# Patient Record
Sex: Male | Born: 1955 | Race: White | Hispanic: No | Marital: Married | Smoking: Current every day smoker
Health system: Southern US, Community
[De-identification: ages and names within clinical notes are randomized; demographics above are authoritative.]

## PROBLEM LIST (undated history)

## (undated) DIAGNOSIS — K2270 Barrett's esophagus without dysplasia: Secondary | ICD-10-CM

## (undated) DIAGNOSIS — K5792 Diverticulitis of intestine, part unspecified, without perforation or abscess without bleeding: Secondary | ICD-10-CM

## (undated) DIAGNOSIS — K449 Diaphragmatic hernia without obstruction or gangrene: Secondary | ICD-10-CM

## (undated) DIAGNOSIS — E785 Hyperlipidemia, unspecified: Secondary | ICD-10-CM

## (undated) HISTORY — PX: TONSILLECTOMY: SUR1361

## (undated) HISTORY — DX: Barrett's esophagus without dysplasia: K22.70

## (undated) HISTORY — DX: Hyperlipidemia, unspecified: E78.5

## (undated) HISTORY — DX: Diverticulitis of intestine, part unspecified, without perforation or abscess without bleeding: K57.92

## (undated) HISTORY — DX: Diaphragmatic hernia without obstruction or gangrene: K44.9

## (undated) HISTORY — PX: OTHER SURGICAL HISTORY: SHX169

---

## 2010-04-15 ENCOUNTER — Ambulatory Visit: Payer: Self-pay | Admitting: Family Medicine

## 2010-07-25 ENCOUNTER — Ambulatory Visit: Payer: Self-pay | Admitting: Gastroenterology

## 2010-07-25 HISTORY — PX: COLONOSCOPY: SHX174

## 2011-09-16 ENCOUNTER — Ambulatory Visit: Payer: Self-pay | Admitting: Family Medicine

## 2013-08-21 IMAGING — CR DG RIBS 2V*L*
1 series · 4 of 4 positions shown · non-contrast
Comparison: none

REASON FOR EXAM: rib pain
COMMENTS:

[Series 1: pa · 0.17mm/px · 4 of 4 slices shown]
[im 1/4]
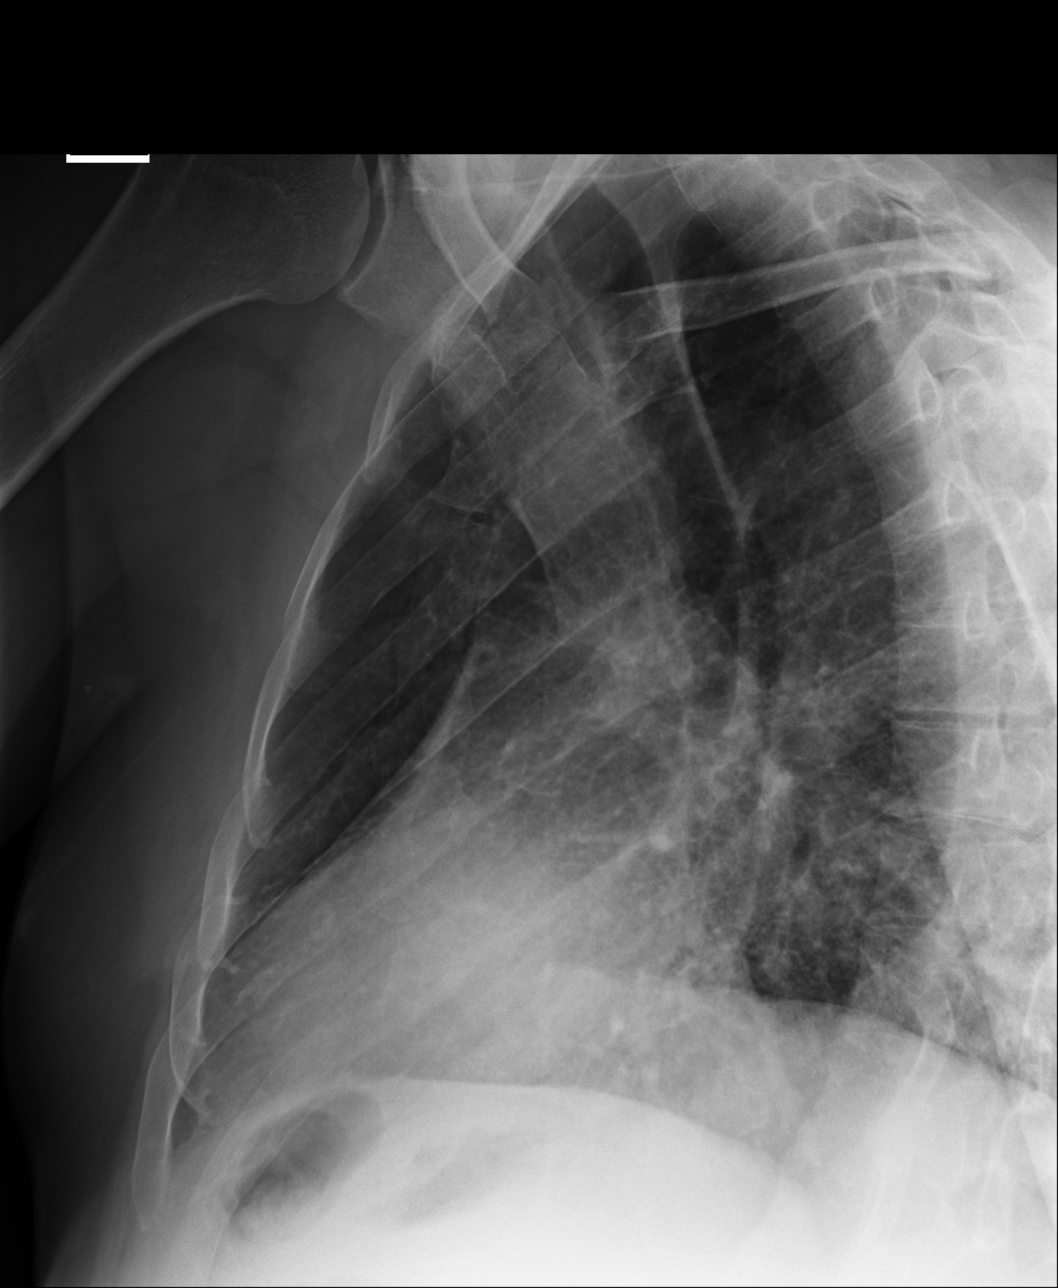
[im 2/4]
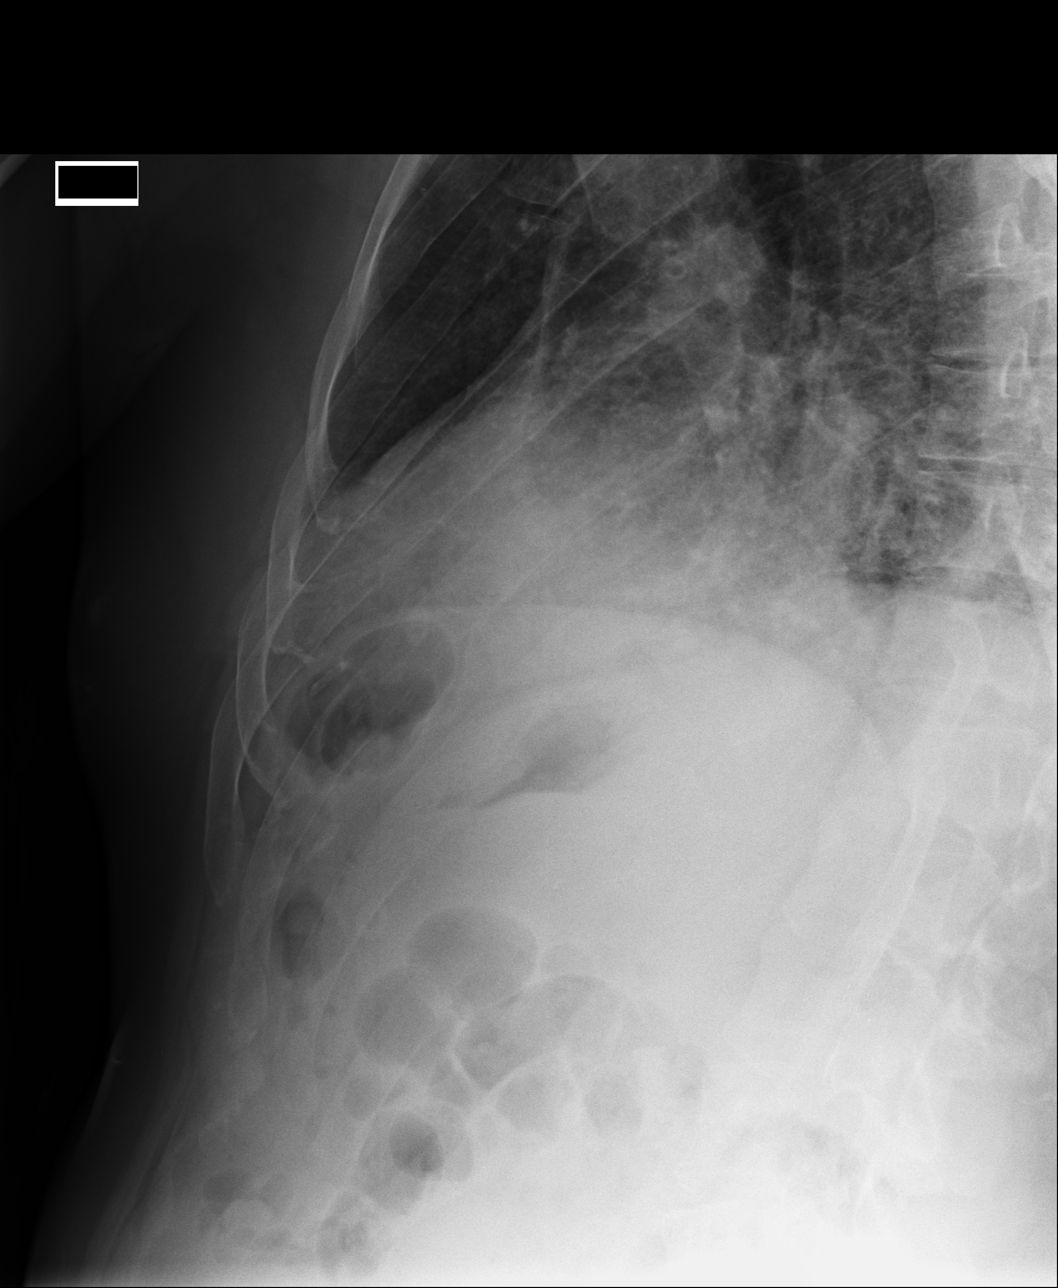
[im 3/4]
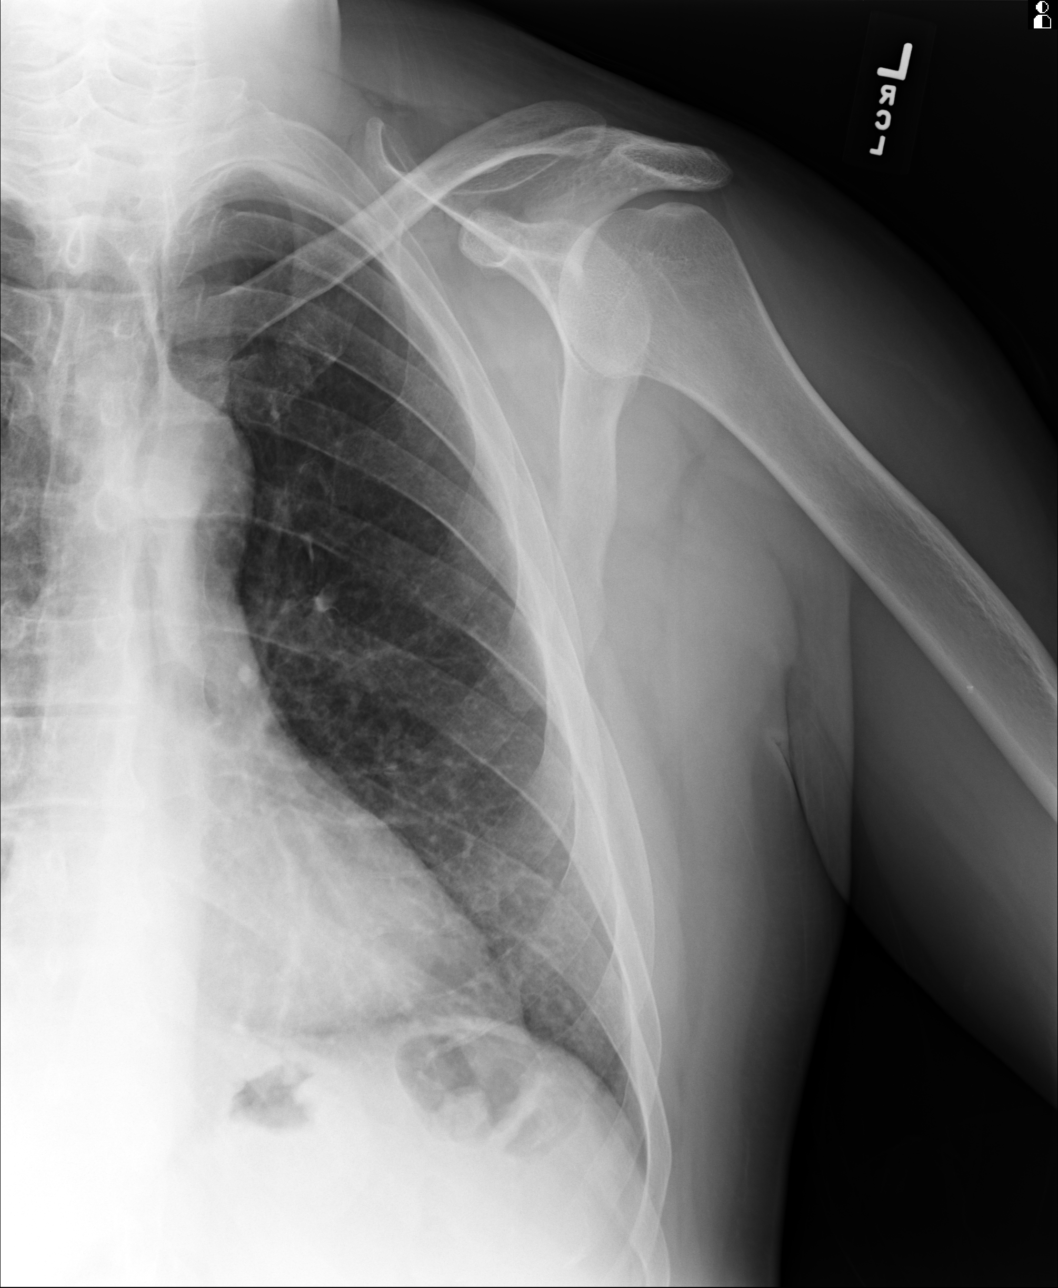
[im 4/4]
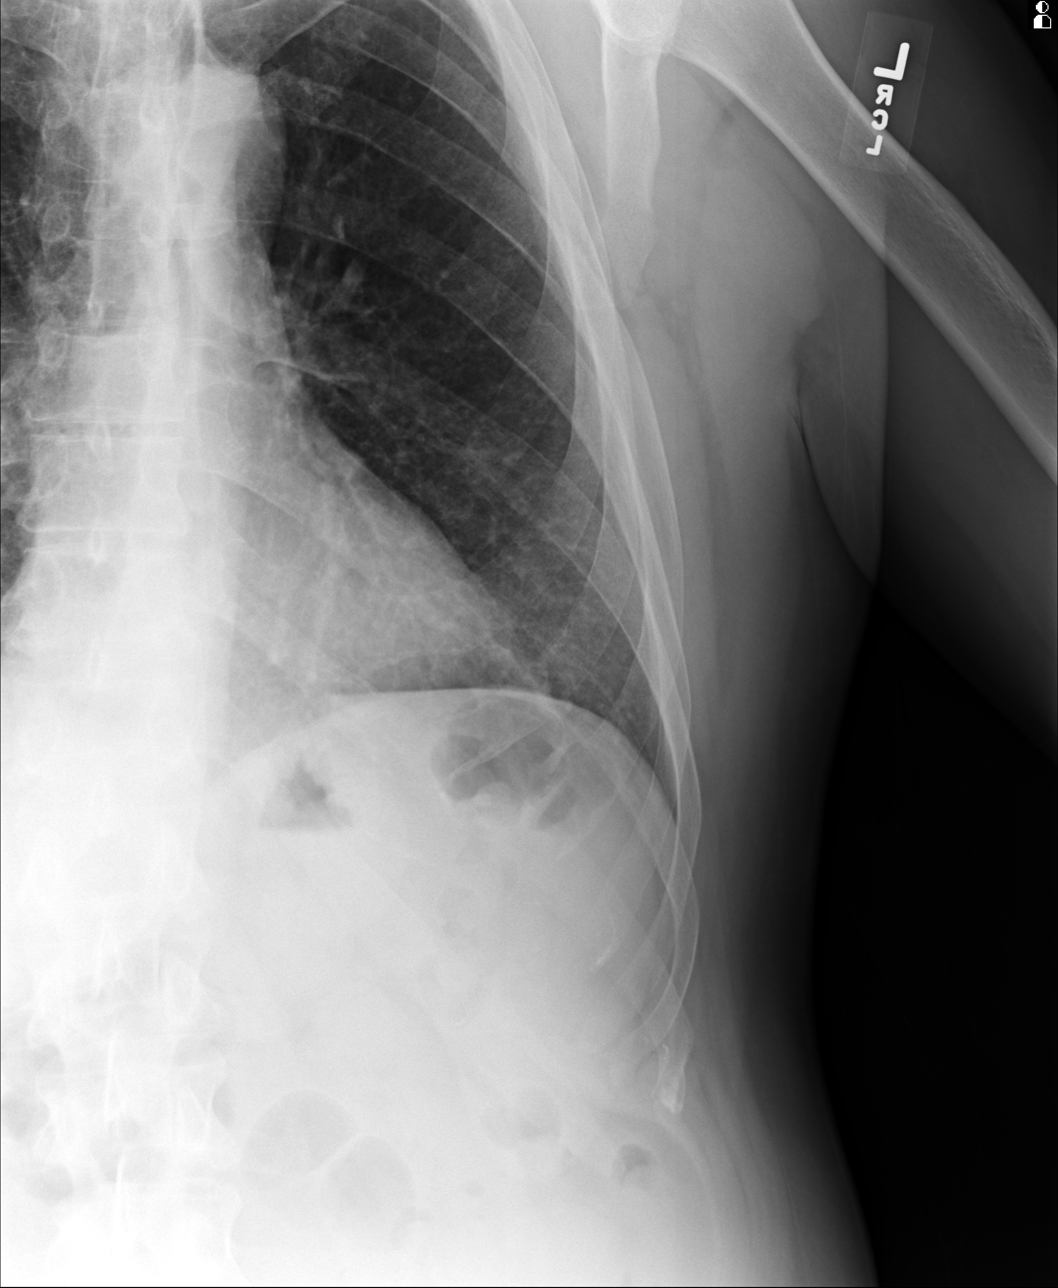

[4 of 4 positions shown; findings below may reference images not displayed]

PROCEDURE:     TIGER - TIGER RIBS LEFT UNILATERAL  - September 16, 2011 [DATE]

RESULT:     Four views of the left rib cage are submitted. The ribs appear
adequately mineralized for age. There is no evidence of an acute displaced
fracture. There is no pneumothorax or pleural effusion nor evidence of a
pulmonary contusion.
IMPRESSION: There is no acute bony abnormality of the visualized
portions of the left rib cage.

[REDACTED]

## 2014-10-09 ENCOUNTER — Encounter: Payer: Self-pay | Admitting: Family Medicine

## 2014-10-09 ENCOUNTER — Ambulatory Visit (INDEPENDENT_AMBULATORY_CARE_PROVIDER_SITE_OTHER): Payer: BLUE CROSS/BLUE SHIELD | Admitting: Family Medicine

## 2014-10-09 VITALS — BP 137/82 | HR 49 | Temp 97.4°F | Ht 70.1 in | Wt 199.0 lb

## 2014-10-09 DIAGNOSIS — E7800 Pure hypercholesterolemia, unspecified: Secondary | ICD-10-CM | POA: Diagnosis not present

## 2014-10-09 DIAGNOSIS — E785 Hyperlipidemia, unspecified: Secondary | ICD-10-CM

## 2014-10-09 DIAGNOSIS — K22719 Barrett's esophagus with dysplasia, unspecified: Secondary | ICD-10-CM

## 2014-10-09 DIAGNOSIS — K2270 Barrett's esophagus without dysplasia: Secondary | ICD-10-CM | POA: Insufficient documentation

## 2014-10-09 LAB — LP+ALT+AST PICCOLO, WAIVED
ALT (SGPT) PICCOLO, WAIVED: 34 U/L (ref 10–47)
AST (SGOT) Piccolo, Waived: 27 U/L (ref 11–38)
CHOLESTEROL PICCOLO, WAIVED: 137 mg/dL (ref <200)
Chol/HDL Ratio Piccolo,Waive: 4.7 mg/dL
HDL CHOL PICCOLO, WAIVED: 29 mg/dL — AB (ref >59)
LDL Chol Calc Piccolo Waived: 87 mg/dL (ref <100)
Triglycerides Piccolo,Waived: 106 mg/dL (ref <150)
VLDL Chol Calc Piccolo,Waive: 21 mg/dL (ref <30)

## 2014-10-09 MED ORDER — ATORVASTATIN CALCIUM 40 MG PO TABS: 40.0000 mg | ORAL_TABLET | Freq: Every day | ORAL | Status: DC

## 2014-10-09 MED ORDER — ESOMEPRAZOLE MAGNESIUM 40 MG PO CPDR: 40.0000 mg | DELAYED_RELEASE_CAPSULE | Freq: Every day | ORAL | Status: DC

## 2014-10-09 NOTE — Progress Notes (Signed)
   BP 137/82 mmHg  Pulse 49  Temp(Src) 97.4 F (36.3 C)  Ht 5' 10.1" (1.781 m)  Wt 199 lb (90.266 kg)  BMI 28.46 kg/m2  SpO2 99%   Subjective:    Patient ID: Theodore Yoder, male    DOB: 12-30-1955, 59 y.o.   MRN: [id number]  HPI: Theodore Yoder is a 59 y.o. male  Chief Complaint  Patient presents with  . Hyperlipidemia   Patient follow-up medication has been doing well. No complaints from atorvastatin only drinking lemon water now and no complaints from liver or kidneys. No myalgias takes medications faithfully without side effects.  Reflux doing well with Barrett's esophagitis no symptoms or complaints. Nexium generic is not as effective but able to get samples of namebrand and does fine.  Relevant past medical, surgical, family and social history reviewed and updated as indicated. Interim medical history since our last visit reviewed. Allergies and medications reviewed and updated.  Review of Systems  Constitutional: Negative.   Respiratory: Negative.   Cardiovascular: Negative.   Gastrointestinal: Negative.     Per HPI unless specifically indicated above     Objective:    BP 137/82 mmHg  Pulse 49  Temp(Src) 97.4 F (36.3 C)  Ht 5' 10.1" (1.781 m)  Wt 199 lb (90.266 kg)  BMI 28.46 kg/m2  SpO2 99%  Wt Readings from Last 3 Encounters:  10/09/14 199 lb (90.266 kg)  04/17/14 204 lb (92.534 kg)    Physical Exam  Constitutional: He is oriented to person, place, and time. He appears well-developed and well-nourished. No distress.  HENT:  Head: Normocephalic and atraumatic.  Right Ear: Hearing normal.  Left Ear: Hearing normal.  Nose: Nose normal.  Eyes: Conjunctivae and lids are normal. Right eye exhibits no discharge. Left eye exhibits no discharge. No scleral icterus.  Cardiovascular: Normal rate, regular rhythm and normal heart sounds.   Pulmonary/Chest: Effort normal and breath sounds normal. No respiratory distress.  Abdominal: There is no  tenderness.  Musculoskeletal: Normal range of motion.  Neurological: He is alert and oriented to person, place, and time.  Skin: Skin is intact. No rash noted.  Psychiatric: He has a normal mood and affect. His speech is normal and behavior is normal. Judgment and thought content normal. Cognition and memory are normal.        Assessment & Plan:   Problem List Items Addressed This Visit      Digestive   Barrett's esophagus    The current medical regimen is effective;  continue present plan and medications. Discussed alternating namebrand with generic to stretch out the namebrand.         Other   Hyperlipidemia    The current medical regimen is effective;  continue present plan and medications.       Relevant Medications   atorvastatin (LIPITOR) 40 MG tablet    Other Visit Diagnoses    Pure hypercholesterolemia    -  Primary    Relevant Medications    atorvastatin (LIPITOR) 40 MG tablet    Other Relevant Orders    LP+ALT+AST Piccolo, Waived        Follow up plan: Return in about 6 months (around 04/09/2015) for Physical Exam.

## 2014-10-09 NOTE — Assessment & Plan Note (Signed)
The current medical regimen is effective;  continue present plan and medications.  

## 2014-10-09 NOTE — Assessment & Plan Note (Addendum)
The current medical regimen is effective;  continue present plan and medications. Discussed alternating namebrand with generic to stretch out the namebrand.

## 2015-04-18 ENCOUNTER — Encounter: Payer: Self-pay | Admitting: Family Medicine

## 2015-04-18 ENCOUNTER — Ambulatory Visit (INDEPENDENT_AMBULATORY_CARE_PROVIDER_SITE_OTHER): Payer: Managed Care, Other (non HMO) | Admitting: Family Medicine

## 2015-04-18 VITALS — BP 144/86 | HR 64 | Temp 97.6°F | Ht 69.8 in | Wt 196.0 lb

## 2015-04-18 DIAGNOSIS — R079 Chest pain, unspecified: Secondary | ICD-10-CM

## 2015-04-18 DIAGNOSIS — K22719 Barrett's esophagus with dysplasia, unspecified: Secondary | ICD-10-CM

## 2015-04-18 LAB — URINALYSIS, ROUTINE W REFLEX MICROSCOPIC
Bilirubin, UA: NEGATIVE
GLUCOSE, UA: NEGATIVE
Ketones, UA: NEGATIVE
Leukocytes, UA: NEGATIVE
Nitrite, UA: NEGATIVE
PROTEIN UA: NEGATIVE
RBC, UA: NEGATIVE
Specific Gravity, UA: 1.015 (ref 1.005–1.030)
Urobilinogen, Ur: 1 mg/dL (ref 0.2–1.0)
pH, UA: 8.5 — ABNORMAL HIGH (ref 5.0–7.5)

## 2015-04-18 MED ORDER — NITROGLYCERIN 0.4 MG SL SUBL: 0.4000 mg | SUBLINGUAL_TABLET | SUBLINGUAL | Status: AC | PRN

## 2015-04-18 MED ORDER — METOPROLOL SUCCINATE ER 25 MG PO TB24
25.0000 mg | ORAL_TABLET | Freq: Every day | ORAL | Status: DC
Start: 2015-04-18 — End: 2015-05-09

## 2015-04-18 NOTE — Assessment & Plan Note (Signed)
Due to the high risk and life-threatening nature of chest pain will refer to cardiology to further evaluate. Patient is stable for now and does not need to proceed by ambulance or today. We'll make an urgent appointment. Discussed with patient 911 for change in symptoms of chest pain. Discussed taking aspirin. Will give a prescription for nitroglycerin. Add metoprolol 25 mg.

## 2015-04-18 NOTE — Addendum Note (Signed)
Addended by: Lurlean HornsWILSON, NANCY H on: 04/18/2015 08:57 AM   Modules accepted: Kipp BroodSmartSet

## 2015-04-18 NOTE — Progress Notes (Signed)
BP 144/86 mmHg  Pulse 64  Temp(Src) 97.6 F (36.4 C)  Ht 5' 9.8" (1.773 m)  Wt 196 lb (88.905 kg)  BMI 28.28 kg/m2  SpO2 95%   Subjective:    Patient ID: Theodore Yoder, male    DOB: Mar 06, 1955, 60 y.o.   MRN: [id number]  HPI: Theodore Yoder is a 60 y.o. male  Chief Complaint  Patient presents with  . Annual Exam  . Chest Pain    x 1week, thinks there is some swelling  Patient for a week or so some left mid chest pain no real radiation no associated nausea vomiting or diaphoresis. Symptoms not necessarily associated with exertion. Patient's been taking Nexium 40 mg every day no real reflux symptoms. Patient had a mild URI about a month ago but otherwise is been doing okay mild cough but nothing productive. Been taking cholesterol medicine without problems. Does have diagnosis of Barrett's esophagitis for which she does take Nexium but no exacerbation of symptoms. Has not checked blood pressure elsewhere but elevated here today.  Patient originally scheduled for physical but will cancel physical for today and do chest pain evaluation.  Relevant past medical, surgical, family and social history reviewed and updated as indicated. Interim medical history since our last visit reviewed. Allergies and medications reviewed and updated.  Review of Systems  Constitutional: Negative for fever, chills, diaphoresis and fatigue.  HENT: Negative.   Eyes: Negative.   Respiratory: Positive for cough. Negative for choking and shortness of breath.   Cardiovascular: Positive for chest pain. Negative for palpitations and leg swelling.  Gastrointestinal: Negative.   Endocrine: Negative.   Genitourinary: Negative.   Musculoskeletal: Negative.   Skin: Negative.   Allergic/Immunologic: Negative.   Neurological: Negative.   Hematological: Negative.   Psychiatric/Behavioral: Negative.     Per HPI unless specifically indicated above     Objective:    BP 144/86 mmHg  Pulse 64   Temp(Src) 97.6 F (36.4 C)  Ht 5' 9.8" (1.773 m)  Wt 196 lb (88.905 kg)  BMI 28.28 kg/m2  SpO2 95%  Wt Readings from Last 3 Encounters:  04/18/15 196 lb (88.905 kg)  10/09/14 199 lb (90.266 kg)  04/17/14 204 lb (92.534 kg)    Physical Exam  Constitutional: He is oriented to person, place, and time. He appears well-developed and well-nourished. No distress.  HENT:  Head: Normocephalic and atraumatic.  Right Ear: Hearing normal.  Left Ear: Hearing normal.  Nose: Nose normal.  Eyes: Conjunctivae and lids are normal. Right eye exhibits no discharge. Left eye exhibits no discharge. No scleral icterus.  Neck: Normal range of motion. Neck supple. No thyromegaly present.  Cardiovascular: Normal rate, regular rhythm and normal heart sounds.   Pulmonary/Chest: Effort normal and breath sounds normal. No respiratory distress.  Abdominal: Soft. Bowel sounds are normal.  Musculoskeletal: Normal range of motion. He exhibits no edema or tenderness.  Lymphadenopathy:    He has no cervical adenopathy.  Neurological: He is alert and oriented to person, place, and time.  Skin: Skin is warm, dry and intact. No rash noted.  Psychiatric: He has a normal mood and affect. His speech is normal and behavior is normal. Judgment and thought content normal. Cognition and memory are normal.    Results for orders placed or performed in visit on 10/09/14  LP+ALT+AST Piccolo, [employer]  Result Value Ref Range   ALT (SGPT) Piccolo, Waived 34 10 - 47 U/L   AST (SGOT) Piccolo, Waived 27 11 -  38 U/L   Cholesterol Piccolo, Waived 137 <200 mg/dL   HDL Chol Piccolo, Waived 29 (L) >59 mg/dL   Triglycerides Piccolo,Waived 106 <150 mg/dL   Chol/HDL Ratio Piccolo,Waive 4.7 mg/dL   LDL Chol Calc Piccolo Waived 87 <100 mg/dL   VLDL Chol Calc Piccolo,Waive 21 <30 mg/dL      Assessment & Plan:   Problem List Items Addressed This Visit      Digestive   Barrett's esophagus - Primary (Chronic)    May be contributing to  patient's chest pain patient has appointment with GI later this year to reevaluate Barrett's        Other   Chest pain    Due to the high risk and life-threatening nature of chest pain will refer to cardiology to further evaluate. Patient is stable for now and does not need to proceed by ambulance or today. We'll make an urgent appointment. Discussed with patient 911 for change in symptoms of chest pain. Discussed taking aspirin. Will give a prescription for nitroglycerin. Add metoprolol 25 mg.      Relevant Orders   CBC with Differential/Platelet   Comprehensive metabolic panel   Lipid Panel w/o Chol/HDL Ratio   Urinalysis, Routine w reflex microscopic (not at [facility])   TSH   EKG 12-Lead (Completed)   Ambulatory referral to Cardiology       Follow up plan: Return for Physical Exam.

## 2015-04-18 NOTE — Assessment & Plan Note (Signed)
May be contributing to patient's chest pain patient has appointment with GI later this year to reevaluate Barrett's

## 2015-04-19 ENCOUNTER — Encounter: Payer: Self-pay | Admitting: Family Medicine

## 2015-04-19 LAB — COMPREHENSIVE METABOLIC PANEL
A/G RATIO: 1.3 (ref 1.2–2.2)
ALK PHOS: 132 IU/L — AB (ref 39–117)
ALT: 21 IU/L (ref 0–44)
AST: 20 IU/L (ref 0–40)
Albumin: 3.9 g/dL (ref 3.5–5.5)
BILIRUBIN TOTAL: 0.7 mg/dL (ref 0.0–1.2)
BUN/Creatinine Ratio: 11 (ref 9–20)
BUN: 10 mg/dL (ref 6–24)
CALCIUM: 9.3 mg/dL (ref 8.7–10.2)
CHLORIDE: 101 mmol/L (ref 96–106)
CO2: 20 mmol/L (ref 18–29)
Creatinine, Ser: 0.89 mg/dL (ref 0.76–1.27)
GFR calc Af Amer: 108 mL/min/{1.73_m2} (ref >59)
GFR, EST NON AFRICAN AMERICAN: 94 mL/min/{1.73_m2} (ref >59)
Globulin, Total: 3 g/dL (ref 1.5–4.5)
Glucose: 93 mg/dL (ref 65–99)
POTASSIUM: 4.2 mmol/L (ref 3.5–5.2)
SODIUM: 137 mmol/L (ref 134–144)
Total Protein: 6.9 g/dL (ref 6.0–8.5)

## 2015-04-19 LAB — LIPID PANEL W/O CHOL/HDL RATIO
CHOLESTEROL TOTAL: 157 mg/dL (ref 100–199)
HDL: 32 mg/dL — AB (ref >39)
LDL Calculated: 97 mg/dL (ref 0–99)
Triglycerides: 138 mg/dL (ref 0–149)
VLDL CHOLESTEROL CAL: 28 mg/dL (ref 5–40)

## 2015-04-19 LAB — CBC WITH DIFFERENTIAL/PLATELET
BASOS ABS: 0 10*3/uL (ref 0.0–0.2)
Basos: 0 %
EOS (ABSOLUTE): 0.2 10*3/uL (ref 0.0–0.4)
Eos: 3 %
Hematocrit: 47.7 % (ref 37.5–51.0)
Hemoglobin: 16.4 g/dL (ref 12.6–17.7)
Immature Grans (Abs): 0 10*3/uL (ref 0.0–0.1)
Immature Granulocytes: 0 %
Lymphocytes Absolute: 1.7 10*3/uL (ref 0.7–3.1)
Lymphs: 33 %
MCH: 30 pg (ref 26.6–33.0)
MCHC: 34.4 g/dL (ref 31.5–35.7)
MCV: 87 fL (ref 79–97)
MONOS ABS: 0.3 10*3/uL (ref 0.1–0.9)
Monocytes: 6 %
NEUTROS ABS: 2.9 10*3/uL (ref 1.4–7.0)
Neutrophils: 58 %
Platelets: 180 10*3/uL (ref 150–379)
RBC: 5.46 x10E6/uL (ref 4.14–5.80)
RDW: 14.1 % (ref 12.3–15.4)
WBC: 5.1 10*3/uL (ref 3.4–10.8)

## 2015-04-19 LAB — TSH: TSH: 3.4 u[IU]/mL (ref 0.450–4.500)

## 2015-04-23 DIAGNOSIS — E785 Hyperlipidemia, unspecified: Secondary | ICD-10-CM | POA: Insufficient documentation

## 2015-05-09 ENCOUNTER — Ambulatory Visit (INDEPENDENT_AMBULATORY_CARE_PROVIDER_SITE_OTHER): Payer: Managed Care, Other (non HMO) | Admitting: Family Medicine

## 2015-05-09 ENCOUNTER — Encounter: Payer: Self-pay | Admitting: Family Medicine

## 2015-05-09 VITALS — BP 133/81 | HR 64 | Temp 97.7°F | Ht 70.1 in | Wt 201.0 lb

## 2015-05-09 DIAGNOSIS — R079 Chest pain, unspecified: Secondary | ICD-10-CM

## 2015-05-09 DIAGNOSIS — E785 Hyperlipidemia, unspecified: Secondary | ICD-10-CM | POA: Diagnosis not present

## 2015-05-09 DIAGNOSIS — Z0000 Encounter for general adult medical examination without abnormal findings: Secondary | ICD-10-CM | POA: Diagnosis not present

## 2015-05-09 DIAGNOSIS — K22719 Barrett's esophagus with dysplasia, unspecified: Secondary | ICD-10-CM

## 2015-05-09 MED ORDER — ESOMEPRAZOLE MAGNESIUM 40 MG PO CPDR
40.0000 mg | DELAYED_RELEASE_CAPSULE | Freq: Every day | ORAL | Status: DC
Start: 2015-05-09 — End: 2016-06-12

## 2015-05-09 MED ORDER — METOPROLOL SUCCINATE ER 25 MG PO TB24: 25.0000 mg | ORAL_TABLET | Freq: Every day | ORAL | Status: DC

## 2015-05-09 MED ORDER — ATORVASTATIN CALCIUM 40 MG PO TABS: 40.0000 mg | ORAL_TABLET | Freq: Every day | ORAL | Status: DC

## 2015-05-09 NOTE — Progress Notes (Signed)
BP 133/81 mmHg  Pulse 64  Temp(Src) 97.7 F (36.5 C)  Ht 5' 10.1" (1.781 m)  Wt 201 lb (91.173 kg)  BMI 28.74 kg/m2  SpO2 98%   Subjective:    Patient ID: Theodore Yoder, male    DOB: 02-09-1955, 60 y.o.   MRN: [id number]  HPI: Theodore Yoder is a 60 y.o. male  Chief Complaint  Patient presents with  . Annual Exam    patient already has had some labs done, NO PSA or HEP C is resulted and he would like these if he needs them  Patient doing well cardiology has scheduled stress test. Patient otherwise no complaints taking cholesterol medicines reflux blood pressure always without problems. No other complaints or issues.   Relevant past medical, surgical, family and social history reviewed and updated as indicated. Interim medical history since our last visit reviewed. Allergies and medications reviewed and updated.  Review of Systems  Constitutional: Negative.   HENT: Negative.   Eyes: Negative.   Respiratory: Negative.   Cardiovascular: Negative.   Gastrointestinal: Negative.   Endocrine: Negative.   Genitourinary: Negative.   Musculoskeletal: Negative.   Skin: Negative.   Allergic/Immunologic: Negative.   Neurological: Negative.   Hematological: Negative.   Psychiatric/Behavioral: Negative.     Per HPI unless specifically indicated above     Objective:    BP 133/81 mmHg  Pulse 64  Temp(Src) 97.7 F (36.5 C)  Ht 5' 10.1" (1.781 m)  Wt 201 lb (91.173 kg)  BMI 28.74 kg/m2  SpO2 98%  Wt Readings from Last 3 Encounters:  05/09/15 201 lb (91.173 kg)  04/18/15 196 lb (88.905 kg)  10/09/14 199 lb (90.266 kg)    Physical Exam  Constitutional: He is oriented to person, place, and time. He appears well-developed and well-nourished.  HENT:  Head: Normocephalic and atraumatic.  Right Ear: External ear normal.  Left Ear: External ear normal.  Eyes: Conjunctivae and EOM are normal. Pupils are equal, round, and reactive to light.  Neck: Normal range of  motion. Neck supple.  Cardiovascular: Normal rate, regular rhythm, normal heart sounds and intact distal pulses.   Pulmonary/Chest: Effort normal and breath sounds normal.  Abdominal: Soft. Bowel sounds are normal. There is no splenomegaly or hepatomegaly.  Genitourinary: Rectum normal, prostate normal and penis normal.  Musculoskeletal: Normal range of motion.  Neurological: He is alert and oriented to person, place, and time. He has normal reflexes.  Skin: No rash noted. No erythema.  Psychiatric: He has a normal mood and affect. His behavior is normal. Judgment and thought content normal.    Results for orders placed or performed in visit on 04/18/15  CBC with Differential/Platelet  Result Value Ref Range   WBC 5.1 3.4 - 10.8 x10E3/uL   RBC 5.46 4.14 - 5.80 x10E6/uL   Hemoglobin 16.4 12.6 - 17.7 g/dL   Hematocrit 40.9 81.1 - 51.0 %   MCV 87 79 - 97 fL   MCH 30.0 26.6 - 33.0 pg   MCHC 34.4 31.5 - 35.7 g/dL   RDW 91.4 78.2 - 95.6 %   Platelets 180 150 - 379 x10E3/uL   Neutrophils 58 %   Lymphs 33 %   Monocytes 6 %   Eos 3 %   Basos 0 %   Neutrophils Absolute 2.9 1.4 - 7.0 x10E3/uL   Lymphocytes Absolute 1.7 0.7 - 3.1 x10E3/uL   Monocytes Absolute 0.3 0.1 - 0.9 x10E3/uL   EOS (ABSOLUTE) 0.2 0.0 - 0.4 x10E3/uL  Basophils Absolute 0.0 0.0 - 0.2 x10E3/uL   Immature Granulocytes 0 %   Immature Grans (Abs) 0.0 0.0 - 0.1 x10E3/uL  Comprehensive metabolic panel  Result Value Ref Range   Glucose 93 65 - 99 mg/dL   BUN 10 6 - 24 mg/dL   Creatinine, Ser 7.840.89 0.76 - 1.27 mg/dL   GFR calc non Af Amer 94 >59 mL/min/1.73   GFR calc Af Amer 108 >59 mL/min/1.73   BUN/Creatinine Ratio 11 9 - 20   Sodium 137 134 - 144 mmol/L   Potassium 4.2 3.5 - 5.2 mmol/L   Chloride 101 96 - 106 mmol/L   CO2 20 18 - 29 mmol/L   Calcium 9.3 8.7 - 10.2 mg/dL   Total Protein 6.9 6.0 - 8.5 g/dL   Albumin 3.9 3.5 - 5.5 g/dL   Globulin, Total 3.0 1.5 - 4.5 g/dL   Albumin/Globulin Ratio 1.3 1.2 - 2.2    Bilirubin Total 0.7 0.0 - 1.2 mg/dL   Alkaline Phosphatase 132 (H) 39 - 117 IU/L   AST 20 0 - 40 IU/L   ALT 21 0 - 44 IU/L  Lipid Panel w/o Chol/HDL Ratio  Result Value Ref Range   Cholesterol, Total 157 100 - 199 mg/dL   Triglycerides 696138 0 - 149 mg/dL   HDL 32 (L) >29>39 mg/dL   VLDL Cholesterol Cal 28 5 - 40 mg/dL   LDL Calculated 97 0 - 99 mg/dL  Urinalysis, Routine w reflex microscopic (not [facility])  Result Value Ref Range   Specific Gravity, UA 1.015 1.005 - 1.030   pH, UA 8.5 (H) 5.0 - 7.5   Color, UA Yellow Yellow   Appearance Ur Clear Clear   Leukocytes, UA Negative Negative   Protein, UA Negative Negative/Trace   Glucose, UA Negative Negative   Ketones, UA Negative Negative   RBC, UA Negative Negative   Bilirubin, UA Negative Negative   Urobilinogen, Ur 1.0 0.2 - 1.0 mg/dL   Nitrite, UA Negative Negative  TSH  Result Value Ref Range   TSH 3.400 0.450 - 4.500 uIU/mL      Assessment & Plan:   Problem List Items Addressed This Visit      Digestive   Barrett's esophagus - Primary (Chronic)    The current medical regimen is effective;  continue present plan and medications.       Relevant Medications   esomeprazole (NEXIUM) 40 MG capsule     Other   Hyperlipidemia    The current medical regimen is effective;  continue present plan and medications.       Relevant Medications   atorvastatin (LIPITOR) 40 MG tablet   metoprolol succinate (TOPROL-XL) 25 MG 24 hr tablet   Chest pain    Stress test pending      Relevant Medications   metoprolol succinate (TOPROL-XL) 25 MG 24 hr tablet    Other Visit Diagnoses    PE (physical exam), annual        Relevant Orders    PSA    Alkaline phosphatase        Follow up plan: Return in about 6 months (around 11/09/2015) for BMP, lipids, alt, ast.

## 2015-05-09 NOTE — Assessment & Plan Note (Signed)
Stress test pending

## 2015-05-09 NOTE — Addendum Note (Signed)
Addended by: Bennetta LaosWILSON, NANCY H on: 05/09/2015 03:20 PM   Modules accepted: Orders, SmartSet

## 2015-05-09 NOTE — Assessment & Plan Note (Signed)
The current medical regimen is effective;  continue present plan and medications.  

## 2015-05-10 ENCOUNTER — Encounter: Payer: Self-pay | Admitting: Family Medicine

## 2015-05-10 LAB — PSA: Prostate Specific Ag, Serum: 0.5 ng/mL (ref 0.0–4.0)

## 2015-05-10 LAB — ALKALINE PHOSPHATASE: Alkaline Phosphatase: 120 IU/L — ABNORMAL HIGH (ref 39–117)

## 2015-05-10 LAB — HEPATITIS C ANTIBODY: Hep C Virus Ab: <0.1 s/co ratio (ref 0.0–0.9)

## 2015-06-05 ENCOUNTER — Encounter: Payer: Managed Care, Other (non HMO) | Admitting: Family Medicine

## 2015-11-15 ENCOUNTER — Ambulatory Visit (INDEPENDENT_AMBULATORY_CARE_PROVIDER_SITE_OTHER): Payer: Managed Care, Other (non HMO) | Admitting: Family Medicine

## 2015-11-15 ENCOUNTER — Encounter: Payer: Self-pay | Admitting: Family Medicine

## 2015-11-15 VITALS — BP 136/65 | HR 54 | Temp 97.8°F | Ht 71.2 in | Wt 200.0 lb

## 2015-11-15 DIAGNOSIS — K22719 Barrett's esophagus with dysplasia, unspecified: Secondary | ICD-10-CM

## 2015-11-15 DIAGNOSIS — E7800 Pure hypercholesterolemia, unspecified: Secondary | ICD-10-CM | POA: Diagnosis not present

## 2015-11-15 LAB — LP+ALT+AST PICCOLO, WAIVED
ALT (SGPT) Piccolo, Waived: 46 U/L (ref 10–47)
AST (SGOT) PICCOLO, WAIVED: 30 U/L (ref 11–38)
CHOLESTEROL PICCOLO, WAIVED: 156 mg/dL (ref <200)
Chol/HDL Ratio Piccolo,Waive: 4.4 mg/dL
HDL Chol Piccolo, Waived: 36 mg/dL — ABNORMAL LOW (ref >59)
LDL CHOL CALC PICCOLO WAIVED: 90 mg/dL (ref <100)
Triglycerides Piccolo,Waived: 154 mg/dL — ABNORMAL HIGH (ref <150)
VLDL Chol Calc Piccolo,Waive: 31 mg/dL — ABNORMAL HIGH (ref <30)

## 2015-11-15 NOTE — Progress Notes (Signed)
BP 136/65 (BP Location: Left Arm, Patient Position: Sitting, Cuff Size: Normal)   Pulse (!) 54   Temp 97.8 F (36.6 C)   Ht 5' 11.2" (1.808 m) Comment: pt had shoes on  Wt 200 lb (90.7 kg) Comment: pt had shoes on  SpO2 97%   BMI 27.74 kg/m    Subjective:    Patient ID: Theodore Yoder, male    DOB: 11/27/1955, 60 y.o.   MRN: [id number]  HPI: Theodore Fractionlwin B Rabadan is a 60 y.o. male  Chief Complaint  Patient presents with  . Barrett's Esophagus  . Hyperlipidemia  Patient follow-up doing well no Barrett's esophagitis symptoms taking Nexium without problems. Cholesterol doing well with medications reviewed this years and last doing well no liver issues no side effects taking faithfully Chest pain no chest pain symptoms reviewed cardiology notes which were normal metoprolol dosing was changed on review patient's blood pressure had been high normal and now is not will leave patient on metoprolol 25 mg as not having any side effects.  Relevant past medical, surgical, family and social history reviewed and updated as indicated. Interim medical history since our last visit reviewed. Allergies and medications reviewed and updated.  Review of Systems  Constitutional: Negative.   Respiratory: Negative.   Cardiovascular: Negative.     Per HPI unless specifically indicated above     Objective:    BP 136/65 (BP Location: Left Arm, Patient Position: Sitting, Cuff Size: Normal)   Pulse (!) 54   Temp 97.8 F (36.6 C)   Ht 5' 11.2" (1.808 m) Comment: pt had shoes on  Wt 200 lb (90.7 kg) Comment: pt had shoes on  SpO2 97%   BMI 27.74 kg/m   Wt Readings from Last 3 Encounters:  11/15/15 200 lb (90.7 kg)  05/09/15 201 lb (91.2 kg)  04/18/15 196 lb (88.9 kg)    Physical Exam  Constitutional: He is oriented to person, place, and time. He appears well-developed and well-nourished. No distress.  HENT:  Head: Normocephalic and atraumatic.  Right Ear: Hearing normal.  Left Ear:  Hearing normal.  Nose: Nose normal.  Eyes: Conjunctivae and lids are normal. Right eye exhibits no discharge. Left eye exhibits no discharge. No scleral icterus.  Cardiovascular: Normal rate, regular rhythm and normal heart sounds.   Pulmonary/Chest: Effort normal and breath sounds normal. No respiratory distress.  Musculoskeletal: Normal range of motion.  Neurological: He is alert and oriented to person, place, and time.  Skin: Skin is intact. No rash noted.  Psychiatric: He has a normal mood and affect. His speech is normal and behavior is normal. Judgment and thought content normal. Cognition and memory are normal.    Results for orders placed or performed in visit on 05/09/15  PSA  Result Value Ref Range   Prostate Specific Ag, Serum 0.5 0.0 - 4.0 ng/mL  Alkaline phosphatase  Result Value Ref Range   Alkaline Phosphatase 120 (H) 39 - 117 IU/L  Hepatitis C Antibody  Result Value Ref Range   Hep C Virus Ab <0.1 0.0 - 0.9 s/co ratio      Assessment & Plan:   Problem List Items Addressed This Visit      Digestive   Barrett's esophagus (Chronic)    The current medical regimen is effective;  continue present plan and medications.         Other   Hyperlipidemia - Primary    The current medical regimen is effective;  continue present plan and  medications.       Relevant Orders   Basic metabolic panel   LP+ALT+AST Piccolo, Waived       Follow up plan: Return in about 6 months (around 05/14/2016) for Physical Exam.

## 2015-11-15 NOTE — Assessment & Plan Note (Signed)
The current medical regimen is effective;  continue present plan and medications.  

## 2015-11-16 LAB — BASIC METABOLIC PANEL
BUN / CREAT RATIO: 13 (ref 10–24)
BUN: 12 mg/dL (ref 8–27)
CO2: 19 mmol/L (ref 18–29)
Calcium: 9.7 mg/dL (ref 8.6–10.2)
Chloride: 101 mmol/L (ref 96–106)
Creatinine, Ser: 0.93 mg/dL (ref 0.76–1.27)
GFR, EST AFRICAN AMERICAN: 103 mL/min/{1.73_m2} (ref >59)
GFR, EST NON AFRICAN AMERICAN: 89 mL/min/{1.73_m2} (ref >59)
Glucose: 87 mg/dL (ref 65–99)
POTASSIUM: 4.5 mmol/L (ref 3.5–5.2)
SODIUM: 140 mmol/L (ref 134–144)

## 2015-11-19 ENCOUNTER — Encounter: Payer: Self-pay | Admitting: Family Medicine

## 2016-05-12 ENCOUNTER — Encounter: Payer: Managed Care, Other (non HMO) | Admitting: Family Medicine

## 2016-06-12 ENCOUNTER — Ambulatory Visit (INDEPENDENT_AMBULATORY_CARE_PROVIDER_SITE_OTHER): Payer: Managed Care, Other (non HMO) | Admitting: Family Medicine

## 2016-06-12 ENCOUNTER — Encounter: Payer: Self-pay | Admitting: Family Medicine

## 2016-06-12 VITALS — BP 130/85 | HR 59 | Ht 70.47 in | Wt 206.0 lb

## 2016-06-12 DIAGNOSIS — Z1329 Encounter for screening for other suspected endocrine disorder: Secondary | ICD-10-CM

## 2016-06-12 DIAGNOSIS — Z79899 Other long term (current) drug therapy: Secondary | ICD-10-CM

## 2016-06-12 DIAGNOSIS — E7800 Pure hypercholesterolemia, unspecified: Secondary | ICD-10-CM | POA: Diagnosis not present

## 2016-06-12 DIAGNOSIS — Z0000 Encounter for general adult medical examination without abnormal findings: Secondary | ICD-10-CM | POA: Diagnosis not present

## 2016-06-12 DIAGNOSIS — K22719 Barrett's esophagus with dysplasia, unspecified: Secondary | ICD-10-CM

## 2016-06-12 DIAGNOSIS — Z125 Encounter for screening for malignant neoplasm of prostate: Secondary | ICD-10-CM

## 2016-06-12 LAB — URINALYSIS, ROUTINE W REFLEX MICROSCOPIC
Bilirubin, UA: NEGATIVE
GLUCOSE, UA: NEGATIVE
Ketones, UA: NEGATIVE
Leukocytes, UA: NEGATIVE
NITRITE UA: NEGATIVE
PH UA: 6.5 (ref 5.0–7.5)
Protein, UA: NEGATIVE
RBC, UA: NEGATIVE
Specific Gravity, UA: 1.015 (ref 1.005–1.030)
UUROB: 0.2 mg/dL (ref 0.2–1.0)

## 2016-06-12 LAB — MICROSCOPIC EXAMINATION: BACTERIA UA: NONE SEEN

## 2016-06-12 MED ORDER — METOPROLOL SUCCINATE ER 25 MG PO TB24: 25.0000 mg | ORAL_TABLET | Freq: Every day | ORAL | 4 refills | Status: DC

## 2016-06-12 MED ORDER — ESOMEPRAZOLE MAGNESIUM 40 MG PO CPDR: 40.0000 mg | DELAYED_RELEASE_CAPSULE | Freq: Every day | ORAL | 4 refills | Status: DC

## 2016-06-12 MED ORDER — ATORVASTATIN CALCIUM 40 MG PO TABS: 40.0000 mg | ORAL_TABLET | Freq: Every day | ORAL | 4 refills | Status: AC

## 2016-06-12 NOTE — Assessment & Plan Note (Signed)
The current medical regimen is effective;  continue present plan and medications.  

## 2016-06-12 NOTE — Progress Notes (Signed)
BP 130/85   Pulse (!) 59   Ht 5' 10.47" (1.79 m)   Wt 206 lb (93.4 kg)   SpO2 96%   BMI 29.16 kg/m    Subjective:    Patient ID: Theodore Yoder, male    DOB: [birth year], 61 y.o.   MRN: [id number]  HPI: Theodore Yoder is a 61 y.o. male  Chief Complaint  Patient presents with  . Annual Exam   Patient all in all doing well takes Lipitor without problems. Blood pressure taking metoprolol without issues good blood pressure control. Takes Nexium faithfully without problems no reflux issues. Hasn't used nitroglycerin.  Relevant past medical, surgical, family and social history reviewed and updated as indicated. Interim medical history since our last visit reviewed. Allergies and medications reviewed and updated.  Review of Systems  Constitutional: Negative.   HENT: Negative.   Eyes: Negative.   Respiratory: Negative.   Cardiovascular: Negative.   Gastrointestinal: Negative.   Endocrine: Negative.   Genitourinary: Negative.   Musculoskeletal: Negative.   Skin: Negative.   Allergic/Immunologic: Negative.   Neurological: Negative.   Hematological: Negative.   Psychiatric/Behavioral: Negative.     Per HPI unless specifically indicated above     Objective:    BP 130/85   Pulse (!) 59   Ht 5' 10.47" (1.79 m)   Wt 206 lb (93.4 kg)   SpO2 96%   BMI 29.16 kg/m   Wt Readings from Last 3 Encounters:  06/12/16 206 lb (93.4 kg)  11/15/15 200 lb (90.7 kg)  05/09/15 201 lb (91.2 kg)    Physical Exam  Constitutional: He is oriented to person, place, and time. He appears well-developed and well-nourished.  HENT:  Head: Normocephalic and atraumatic.  Right Ear: External ear normal.  Left Ear: External ear normal.  Eyes: Conjunctivae and EOM are normal. Pupils are equal, round, and reactive to light.  Neck: Normal range of motion. Neck supple.  Cardiovascular: Normal rate, regular rhythm, normal heart sounds and intact distal pulses.   Pulmonary/Chest: Effort  normal and breath sounds normal.  Abdominal: Soft. Bowel sounds are normal. There is no splenomegaly or hepatomegaly.  Genitourinary: Rectum normal, prostate normal and penis normal.  Musculoskeletal: Normal range of motion.  Neurological: He is alert and oriented to person, place, and time. He has normal reflexes.  Skin: No rash noted. No erythema.  Psychiatric: He has a normal mood and affect. His behavior is normal. Judgment and thought content normal.    Results for orders placed or performed in visit on 11/15/15  Basic metabolic panel  Result Value Ref Range   Glucose 87 65 - 99 mg/dL   BUN 12 8 - 27 mg/dL   Creatinine, Ser 4.09 0.76 - 1.27 mg/dL   GFR calc non Af Amer 89 >59 mL/min/1.73   GFR calc Af Amer 103 >59 mL/min/1.73   BUN/Creatinine Ratio 13 10 - 24   Sodium 140 134 - 144 mmol/L   Potassium 4.5 3.5 - 5.2 mmol/L   Chloride 101 96 - 106 mmol/L   CO2 19 18 - 29 mmol/L   Calcium 9.7 8.6 - 10.2 mg/dL  LP+ALT+AST Piccolo, Waived  Result Value Ref Range   ALT (SGPT) Piccolo, Waived 46 10 - 47 U/L   AST (SGOT) Piccolo, Waived 30 11 - 38 U/L   Cholesterol Piccolo, Waived 156 <200 mg/dL   HDL Chol Piccolo, Waived 36 (L) >59 mg/dL   Triglycerides Piccolo,Waived 154 (H) <150 mg/dL   Chol/HDL Ratio Piccolo,Waive  4.4 mg/dL   LDL Chol Calc Piccolo Waived 90 <100 mg/dL   VLDL Chol Calc Piccolo,Waive 31 (H) <30 mg/dL      Assessment & Plan:   Problem List Items Addressed This Visit      Digestive   Barrett's esophagus (Chronic)    The current medical regimen is effective;  continue present plan and medications.       Relevant Medications   esomeprazole (NEXIUM) 40 MG capsule     Other   Hyperlipidemia    The current medical regimen is effective;  continue present plan and medications.       Relevant Medications   atorvastatin (LIPITOR) 40 MG tablet   metoprolol succinate (TOPROL-XL) 25 MG 24 hr tablet   Other Relevant Orders   CBC with Differential/Platelet    Lipid panel   Urinalysis, Routine w reflex microscopic    Other Visit Diagnoses    Annual physical exam    -  Primary   Relevant Orders   Lipid panel   PSA   TSH   Urinalysis, Routine w reflex microscopic   Medication management       Relevant Orders   CBC with Differential/Platelet   Comprehensive metabolic panel   Urinalysis, Routine w reflex microscopic   Prostate cancer screening       Relevant Orders   PSA   Thyroid disorder screen       Relevant Orders   TSH       Follow up plan: Return in about 6 months (around 12/12/2016) for BMP,  Lipids, ALT, AST.

## 2016-06-13 LAB — CBC WITH DIFFERENTIAL/PLATELET
BASOS: 1 %
Basophils Absolute: 0 10*3/uL (ref 0.0–0.2)
EOS (ABSOLUTE): 0.1 10*3/uL (ref 0.0–0.4)
Eos: 2 %
HEMATOCRIT: 47.6 % (ref 37.5–51.0)
Hemoglobin: 16.7 g/dL (ref 13.0–17.7)
IMMATURE GRANULOCYTES: 0 %
Immature Grans (Abs): 0 10*3/uL (ref 0.0–0.1)
LYMPHS ABS: 2.3 10*3/uL (ref 0.7–3.1)
Lymphs: 37 %
MCH: 30.1 pg (ref 26.6–33.0)
MCHC: 35.1 g/dL (ref 31.5–35.7)
MCV: 86 fL (ref 79–97)
MONOS ABS: 0.3 10*3/uL (ref 0.1–0.9)
Monocytes: 5 %
NEUTROS PCT: 55 %
Neutrophils Absolute: 3.5 10*3/uL (ref 1.4–7.0)
PLATELETS: 184 10*3/uL (ref 150–379)
RBC: 5.54 x10E6/uL (ref 4.14–5.80)
RDW: 13.9 % (ref 12.3–15.4)
WBC: 6.3 10*3/uL (ref 3.4–10.8)

## 2016-06-13 LAB — LIPID PANEL
CHOL/HDL RATIO: 4.4 ratio (ref 0.0–5.0)
CHOLESTEROL TOTAL: 141 mg/dL (ref 100–199)
HDL: 32 mg/dL — AB (ref >39)
LDL Calculated: 81 mg/dL (ref 0–99)
TRIGLYCERIDES: 140 mg/dL (ref 0–149)
VLDL Cholesterol Cal: 28 mg/dL (ref 5–40)

## 2016-06-13 LAB — COMPREHENSIVE METABOLIC PANEL
A/G RATIO: 1.3 (ref 1.2–2.2)
ALK PHOS: 145 IU/L — AB (ref 39–117)
ALT: 34 IU/L (ref 0–44)
AST: 21 IU/L (ref 0–40)
Albumin: 4.2 g/dL (ref 3.6–4.8)
BILIRUBIN TOTAL: 0.6 mg/dL (ref 0.0–1.2)
BUN/Creatinine Ratio: 13 (ref 10–24)
BUN: 13 mg/dL (ref 8–27)
CALCIUM: 9.5 mg/dL (ref 8.6–10.2)
CHLORIDE: 101 mmol/L (ref 96–106)
CO2: 18 mmol/L (ref 18–29)
Creatinine, Ser: 0.99 mg/dL (ref 0.76–1.27)
GFR calc Af Amer: 95 mL/min/{1.73_m2} (ref >59)
GFR, EST NON AFRICAN AMERICAN: 82 mL/min/{1.73_m2} (ref >59)
GLOBULIN, TOTAL: 3.2 g/dL (ref 1.5–4.5)
Glucose: 107 mg/dL — ABNORMAL HIGH (ref 65–99)
POTASSIUM: 4.2 mmol/L (ref 3.5–5.2)
SODIUM: 137 mmol/L (ref 134–144)
Total Protein: 7.4 g/dL (ref 6.0–8.5)

## 2016-06-13 LAB — PSA: Prostate Specific Ag, Serum: 0.8 ng/mL (ref 0.0–4.0)

## 2016-06-13 LAB — TSH: TSH: 3.95 u[IU]/mL (ref 0.450–4.500)

## 2016-06-16 ENCOUNTER — Encounter: Payer: Self-pay | Admitting: Family Medicine

## 2016-12-15 ENCOUNTER — Ambulatory Visit: Payer: Managed Care, Other (non HMO) | Admitting: Family Medicine

## 2016-12-18 ENCOUNTER — Ambulatory Visit: Payer: Self-pay | Admitting: Family Medicine

## 2016-12-22 ENCOUNTER — Encounter: Payer: Self-pay | Admitting: Family Medicine

## 2016-12-22 ENCOUNTER — Ambulatory Visit: Payer: 59 | Admitting: Family Medicine

## 2016-12-22 VITALS — BP 132/86 | HR 57 | Wt 206.0 lb

## 2016-12-22 DIAGNOSIS — K22719 Barrett's esophagus with dysplasia, unspecified: Secondary | ICD-10-CM

## 2016-12-22 DIAGNOSIS — I209 Angina pectoris, unspecified: Secondary | ICD-10-CM

## 2016-12-22 DIAGNOSIS — E7800 Pure hypercholesterolemia, unspecified: Secondary | ICD-10-CM | POA: Diagnosis not present

## 2016-12-22 LAB — LP+ALT+AST PICCOLO, WAIVED
ALT (SGPT) PICCOLO, WAIVED: 41 U/L (ref 10–47)
AST (SGOT) PICCOLO, WAIVED: 22 U/L (ref 11–38)
Chol/HDL Ratio Piccolo,Waive: 4.5 mg/dL
Cholesterol Piccolo, Waived: 144 mg/dL (ref <200)
HDL CHOL PICCOLO, WAIVED: 32 mg/dL — AB (ref >59)
LDL Chol Calc Piccolo Waived: 88 mg/dL (ref <100)
Triglycerides Piccolo,Waived: 117 mg/dL (ref <150)
VLDL CHOL CALC PICCOLO,WAIVE: 23 mg/dL (ref <30)

## 2016-12-22 NOTE — Assessment & Plan Note (Signed)
The current medical regimen is effective;  continue present plan and medications.  

## 2016-12-22 NOTE — Assessment & Plan Note (Signed)
Patient currently with no symptoms has nitroglycerin as not taking has metoprolol also for cardiac protection.

## 2016-12-22 NOTE — Progress Notes (Signed)
BP 132/86 (BP Location: Left Arm)   Pulse (!) 57   Wt 206 lb (93.4 kg)   SpO2 (!) 57%   BMI 29.16 kg/m    Subjective:    Patient ID: Theodore Yoder, male    DOB: 02/11/1955, 61 y.o.   MRN: [id number]  HPI: Theodore Fractionlwin B Skiff is a 61 y.o. male  Chief Complaint  Patient presents with  . Follow-up  Patient all ll doing well taking medications without problems Cholesterol doing well with Lipitor takes faithfully without problems Same with Nexium no reflux issues. Blood pressure doing well with metoprolol no issues or concerns. Has not used nitroglycerin. Relevant past medical, surgical, family and social history reviewed and updated as indicated. Interim medical history since our last visit reviewed. Allergies and medications reviewed and updated.  Review of Systems  Constitutional: Negative.   Respiratory: Negative.   Cardiovascular: Negative.     Per HPI unless specifically indicated above     Objective:    BP 132/86 (BP Location: Left Arm)   Pulse (!) 57   Wt 206 lb (93.4 kg)   SpO2 (!) 57%   BMI 29.16 kg/m   Wt Readings from Last 3 Encounters:  12/22/16 206 lb (93.4 kg)  06/12/16 206 lb (93.4 kg)  11/15/15 200 lb (90.7 kg)    Physical Exam  Constitutional: He is oriented to person, place, and time. He appears well-developed and well-nourished.  HENT:  Head: Normocephalic and atraumatic.  Eyes: Conjunctivae and EOM are normal.  Neck: Normal range of motion.  Cardiovascular: Normal rate, regular rhythm and normal heart sounds.  Pulmonary/Chest: Effort normal and breath sounds normal.  Musculoskeletal: Normal range of motion.  Neurological: He is alert and oriented to person, place, and time.  Skin: No erythema.  Psychiatric: He has a normal mood and affect. His behavior is normal. Judgment and thought content normal.    Results for orders placed or performed in visit on 06/12/16  Microscopic Examination  Result Value Ref Range   WBC, UA 0-5 0 - 5  /hpf   RBC, UA 0-2 0 - 2 /hpf   Epithelial Cells (non renal) CANCELED    Bacteria, UA None seen None seen/Few  CBC with Differential/Platelet  Result Value Ref Range   WBC 6.3 3.4 - 10.8 x10E3/uL   RBC 5.54 4.14 - 5.80 x10E6/uL   Hemoglobin 16.7 13.0 - 17.7 g/dL   Hematocrit 40.947.6 81.137.5 - 51.0 %   MCV 86 79 - 97 fL   MCH 30.1 26.6 - 33.0 pg   MCHC 35.1 31.5 - 35.7 g/dL   RDW 91.413.9 78.212.3 - 95.615.4 %   Platelets 184 150 - 379 x10E3/uL   Neutrophils 55 Not Estab. %   Lymphs 37 Not Estab. %   Monocytes 5 Not Estab. %   Eos 2 Not Estab. %   Basos 1 Not Estab. %   Neutrophils Absolute 3.5 1.4 - 7.0 x10E3/uL   Lymphocytes Absolute 2.3 0.7 - 3.1 x10E3/uL   Monocytes Absolute 0.3 0.1 - 0.9 x10E3/uL   EOS (ABSOLUTE) 0.1 0.0 - 0.4 x10E3/uL   Basophils Absolute 0.0 0.0 - 0.2 x10E3/uL   Immature Granulocytes 0 Not Estab. %   Immature Grans (Abs) 0.0 0.0 - 0.1 x10E3/uL  Comprehensive metabolic panel  Result Value Ref Range   Glucose 107 (H) 65 - 99 mg/dL   BUN 13 8 - 27 mg/dL   Creatinine, Ser 2.130.99 0.76 - 1.27 mg/dL   GFR calc non Af  Amer 82 >59 mL/min/1.73   GFR calc Af Amer 95 >59 mL/min/1.73   BUN/Creatinine Ratio 13 10 - 24   Sodium 137 134 - 144 mmol/L   Potassium 4.2 3.5 - 5.2 mmol/L   Chloride 101 96 - 106 mmol/L   CO2 18 18 - 29 mmol/L   Calcium 9.5 8.6 - 10.2 mg/dL   Total Protein 7.4 6.0 - 8.5 g/dL   Albumin 4.2 3.6 - 4.8 g/dL   Globulin, Total 3.2 1.5 - 4.5 g/dL   Albumin/Globulin Ratio 1.3 1.2 - 2.2   Bilirubin Total 0.6 0.0 - 1.2 mg/dL   Alkaline Phosphatase 145 (H) 39 - 117 IU/L   AST 21 0 - 40 IU/L   ALT 34 0 - 44 IU/L  Lipid panel  Result Value Ref Range   Cholesterol, Total 141 100 - 199 mg/dL   Triglycerides 161140 0 - 149 mg/dL   HDL 32 (L) >09>39 mg/dL   VLDL Cholesterol Cal 28 5 - 40 mg/dL   LDL Calculated 81 0 - 99 mg/dL   Chol/HDL Ratio 4.4 0.0 - 5.0 ratio  PSA  Result Value Ref Range   Prostate Specific Ag, Serum 0.8 0.0 - 4.0 ng/mL  TSH  Result Value Ref  Range   TSH 3.950 0.450 - 4.500 uIU/mL  Urinalysis, Routine w reflex microscopic  Result Value Ref Range   Specific Gravity, UA 1.015 1.005 - 1.030   pH, UA 6.5 5.0 - 7.5   Color, UA Yellow Yellow   Appearance Ur Clear Clear   Leukocytes, UA Negative Negative   Protein, UA Negative Negative/Trace   Glucose, UA Negative Negative   Ketones, UA Negative Negative   RBC, UA Negative Negative   Bilirubin, UA Negative Negative   Urobilinogen, Ur 0.2 0.2 - 1.0 mg/dL   Nitrite, UA Negative Negative   Microscopic Examination See below:       Assessment & Plan:   Problem List Items Addressed This Visit      Cardiovascular and Mediastinum   Angina pectoris (HCC)    Patient currently with no symptoms has nitroglycerin as not taking has metoprolol also for cardiac protection.        Digestive   Barrett's esophagus (Chronic)    The current medical regimen is effective;  continue present plan and medications.         Other   Hyperlipidemia - Primary    The current medical regimen is effective;  continue present plan and medications.       Relevant Orders   Basic metabolic panel   LP+ALT+AST Piccolo, Waived       Follow up plan: Return in about 6 months (around 06/22/2017) for Physical Exam.

## 2016-12-23 ENCOUNTER — Encounter: Payer: Self-pay | Admitting: Family Medicine

## 2016-12-23 LAB — BASIC METABOLIC PANEL
BUN/Creatinine Ratio: 14 (ref 10–24)
BUN: 13 mg/dL (ref 8–27)
CO2: 20 mmol/L (ref 20–29)
CREATININE: 0.93 mg/dL (ref 0.76–1.27)
Calcium: 9.4 mg/dL (ref 8.6–10.2)
Chloride: 103 mmol/L (ref 96–106)
GFR calc Af Amer: 102 mL/min/{1.73_m2} (ref >59)
GFR, EST NON AFRICAN AMERICAN: 88 mL/min/{1.73_m2} (ref >59)
GLUCOSE: 106 mg/dL — AB (ref 65–99)
Potassium: 4.2 mmol/L (ref 3.5–5.2)
SODIUM: 139 mmol/L (ref 134–144)

## 2017-02-12 ENCOUNTER — Ambulatory Visit: Payer: Self-pay

## 2017-02-12 NOTE — Telephone Encounter (Signed)
Left message on machine for pt to return call to the office.  

## 2017-02-12 NOTE — Telephone Encounter (Signed)
Pt. Called requesting Dr. Dossie Arbourrissman write a letter for him. States because of his Barrett's esophagus, sometimes he has discomfort across his upper abdomin and will push his seatbelt below his shoulder. States he got a ticket for improper seatbelt and was told if he had a letter from his doctor, he would be covered/excused. Please call pt. Back with an answer.

## 2017-02-12 NOTE — Telephone Encounter (Signed)
Call pt  By the way, my answer is no way

## 2017-02-17 NOTE — Telephone Encounter (Signed)
Left message on machine for pt to return call to the office.  

## 2017-02-19 NOTE — Telephone Encounter (Signed)
Left message on machine for pt to return call to the office.Will close encounter until pt returns call.

## 2017-06-15 ENCOUNTER — Other Ambulatory Visit: Payer: Self-pay | Admitting: Family Medicine

## 2017-06-15 DIAGNOSIS — K22719 Barrett's esophagus with dysplasia, unspecified: Secondary | ICD-10-CM

## 2017-06-22 ENCOUNTER — Encounter: Payer: Self-pay | Admitting: Family Medicine

## 2017-06-22 ENCOUNTER — Ambulatory Visit (INDEPENDENT_AMBULATORY_CARE_PROVIDER_SITE_OTHER): Payer: 59 | Admitting: Family Medicine

## 2017-06-22 VITALS — BP 156/94 | HR 72 | Ht 70.47 in | Wt 197.0 lb

## 2017-06-22 DIAGNOSIS — Z131 Encounter for screening for diabetes mellitus: Secondary | ICD-10-CM | POA: Diagnosis not present

## 2017-06-22 DIAGNOSIS — Z0000 Encounter for general adult medical examination without abnormal findings: Secondary | ICD-10-CM | POA: Diagnosis not present

## 2017-06-22 DIAGNOSIS — Z114 Encounter for screening for human immunodeficiency virus [HIV]: Secondary | ICD-10-CM | POA: Diagnosis not present

## 2017-06-22 DIAGNOSIS — Z79899 Other long term (current) drug therapy: Secondary | ICD-10-CM

## 2017-06-22 DIAGNOSIS — E7800 Pure hypercholesterolemia, unspecified: Secondary | ICD-10-CM | POA: Diagnosis not present

## 2017-06-22 DIAGNOSIS — M1710 Unilateral primary osteoarthritis, unspecified knee: Secondary | ICD-10-CM | POA: Diagnosis not present

## 2017-06-22 DIAGNOSIS — Z1329 Encounter for screening for other suspected endocrine disorder: Secondary | ICD-10-CM

## 2017-06-22 DIAGNOSIS — K2270 Barrett's esophagus without dysplasia: Secondary | ICD-10-CM

## 2017-06-22 DIAGNOSIS — Z125 Encounter for screening for malignant neoplasm of prostate: Secondary | ICD-10-CM | POA: Diagnosis not present

## 2017-06-22 LAB — MICROSCOPIC EXAMINATION: Bacteria, UA: NONE SEEN

## 2017-06-22 LAB — URINALYSIS, ROUTINE W REFLEX MICROSCOPIC
Bilirubin, UA: NEGATIVE
Glucose, UA: NEGATIVE
Ketones, UA: NEGATIVE
Leukocytes, UA: NEGATIVE
NITRITE UA: NEGATIVE
PH UA: 6 (ref 5.0–7.5)
Protein, UA: NEGATIVE
Specific Gravity, UA: 1.015 (ref 1.005–1.030)
UUROB: 2 mg/dL — AB (ref 0.2–1.0)

## 2017-06-22 NOTE — Assessment & Plan Note (Signed)
Discussed knee arthropathy Advil Tylenol etc. if becomes progressively worse will consider orthopedic referral.

## 2017-06-22 NOTE — Progress Notes (Signed)
BP (!) 156/94   Pulse 72   Ht 5' 10.47" (1.79 m)   Wt 197 lb (89.4 kg)   SpO2 98%   BMI 27.89 kg/m    Subjective:    Patient ID: Theodore Yoder, male    DOB: 01/27/1955, 62 y.o.   MRN: [id number]  HPI: Theodore Fractionlwin B Lavey is a 62 y.o. male  Chief Complaint  Patient presents with  . Annual Exam  . Fall    Patient fell last month and broke 2 ribs on the right side  . Knee Pain    Worsening  Patient follow-up fell last month broke 2 ribs is back to normal off pain medication and is okay now. Biggest problem is bilateral knee arthritis just worsening no real clicking locking giving way just some soreness especially with standing or getting out of a chair. Has not taken Tylenol or other OTC medications. Nexium can only take 40 mg for 90 days is paid for otherwise will need to buy OTC discussed various options with patient Blood pressure metoprolol doing well without problems.   Relevant past medical, surgical, family and social history reviewed and updated as indicated. Interim medical history since our last visit reviewed. Allergies and medications reviewed and updated.  Review of Systems  Constitutional: Negative.   HENT: Negative.   Eyes: Negative.   Respiratory: Negative.   Cardiovascular: Negative.   Gastrointestinal: Negative.   Endocrine: Negative.   Genitourinary: Negative.   Musculoskeletal: Negative.   Skin: Negative.   Allergic/Immunologic: Negative.   Neurological: Negative.   Hematological: Negative.   Psychiatric/Behavioral: Negative.     Per HPI unless specifically indicated above     Objective:    BP (!) 156/94   Pulse 72   Ht 5' 10.47" (1.79 m)   Wt 197 lb (89.4 kg)   SpO2 98%   BMI 27.89 kg/m   Wt Readings from Last 3 Encounters:  06/22/17 197 lb (89.4 kg)  12/22/16 206 lb (93.4 kg)  06/12/16 206 lb (93.4 kg)    Physical Exam  Constitutional: He is oriented to person, place, and time. He appears well-developed and well-nourished.    HENT:  Head: Normocephalic and atraumatic.  Right Ear: External ear normal.  Left Ear: External ear normal.  Eyes: Pupils are equal, round, and reactive to light. Conjunctivae and EOM are normal.  Neck: Normal range of motion. Neck supple.  Cardiovascular: Normal rate, regular rhythm, normal heart sounds and intact distal pulses.  Pulmonary/Chest: Effort normal and breath sounds normal.  Abdominal: Soft. Bowel sounds are normal. There is no splenomegaly or hepatomegaly.  Genitourinary: Rectum normal, prostate normal and penis normal.  Musculoskeletal: Normal range of motion.  Neurological: He is alert and oriented to person, place, and time. He has normal reflexes.  Skin: No rash noted. No erythema.  Psychiatric: He has a normal mood and affect. His behavior is normal. Judgment and thought content normal.    Results for orders placed or performed in visit on 12/22/16  Basic metabolic panel  Result Value Ref Range   Glucose 106 (H) 65 - 99 mg/dL   BUN 13 8 - 27 mg/dL   Creatinine, Ser 1.300.93 0.76 - 1.27 mg/dL   GFR calc non Af Amer 88 >59 mL/min/1.73   GFR calc Af Amer 102 >59 mL/min/1.73   BUN/Creatinine Ratio 14 10 - 24   Sodium 139 134 - 144 mmol/L   Potassium 4.2 3.5 - 5.2 mmol/L   Chloride 103 96 - 106  mmol/L   CO2 20 20 - 29 mmol/L   Calcium 9.4 8.6 - 10.2 mg/dL  LP+ALT+AST Piccolo, Waived  Result Value Ref Range   ALT (SGPT) Piccolo, Waived 41 10 - 47 U/L   AST (SGOT) Piccolo, Waived 22 11 - 38 U/L   Cholesterol Piccolo, Waived 144 <200 mg/dL   HDL Chol Piccolo, Waived 32 (L) >59 mg/dL   Triglycerides Piccolo,Waived 117 <150 mg/dL   Chol/HDL Ratio Piccolo,Waive 4.5 mg/dL   LDL Chol Calc Piccolo Waived 88 <100 mg/dL   VLDL Chol Calc Piccolo,Waive 23 <30 mg/dL      Assessment & Plan:   Problem List Items Addressed This Visit      Digestive   Barrett's esophagus without dysplasia    Discussed reflux issues and medication will use Nexium 40 mg intermittently and  Nexium 20 mg on a regular basis.  If worsening and Nexium 20 is not adequate will need to discuss with GI.        Musculoskeletal and Integument   Knee arthropathy    Discussed knee arthropathy Advil Tylenol etc. if becomes progressively worse will consider orthopedic referral.        Other   Hyperlipidemia - Primary    The current medical regimen is effective;  continue present plan and medications.       Relevant Orders   CBC with Differential/Platelet   Comprehensive metabolic panel   Lipid panel   Urinalysis, Routine w reflex microscopic    Other Visit Diagnoses    Medication management       Annual physical exam       Relevant Orders   CBC with Differential/Platelet   Comprehensive metabolic panel   Lipid panel   PSA   TSH   Urinalysis, Routine w reflex microscopic   Thyroid disorder screen       Relevant Orders   TSH   Prostate cancer screening       Relevant Orders   PSA   Screening for diabetes mellitus (DM)       Relevant Orders   CBC with Differential/Platelet   Comprehensive metabolic panel   Lipid panel   Urinalysis, Routine w reflex microscopic   Encounter for screening for HIV       Relevant Orders   HIV antibody (with reflex)       Follow up plan: Return in about 6 months (around 12/22/2017) for BMP,  Lipids, ALT, AST.

## 2017-06-22 NOTE — Assessment & Plan Note (Signed)
The current medical regimen is effective;  continue present plan and medications.  

## 2017-06-22 NOTE — Assessment & Plan Note (Signed)
Discussed reflux issues and medication will use Nexium 40 mg intermittently and Nexium 20 mg on a regular basis.  If worsening and Nexium 20 is not adequate will need to discuss with GI.

## 2017-06-23 ENCOUNTER — Encounter: Payer: Self-pay | Admitting: Family Medicine

## 2017-06-23 LAB — COMPREHENSIVE METABOLIC PANEL
A/G RATIO: 1.4 (ref 1.2–2.2)
ALK PHOS: 145 IU/L — AB (ref 39–117)
ALT: 22 IU/L (ref 0–44)
AST: 18 IU/L (ref 0–40)
Albumin: 4.2 g/dL (ref 3.6–4.8)
BILIRUBIN TOTAL: 0.5 mg/dL (ref 0.0–1.2)
BUN/Creatinine Ratio: 11 (ref 10–24)
BUN: 11 mg/dL (ref 8–27)
CHLORIDE: 102 mmol/L (ref 96–106)
CO2: 20 mmol/L (ref 20–29)
Calcium: 9.9 mg/dL (ref 8.6–10.2)
Creatinine, Ser: 0.99 mg/dL (ref 0.76–1.27)
GFR calc Af Amer: 95 mL/min/{1.73_m2} (ref >59)
GFR calc non Af Amer: 82 mL/min/{1.73_m2} (ref >59)
GLUCOSE: 102 mg/dL — AB (ref 65–99)
Globulin, Total: 2.9 g/dL (ref 1.5–4.5)
POTASSIUM: 4.1 mmol/L (ref 3.5–5.2)
Sodium: 139 mmol/L (ref 134–144)
Total Protein: 7.1 g/dL (ref 6.0–8.5)

## 2017-06-23 LAB — LIPID PANEL
CHOLESTEROL TOTAL: 134 mg/dL (ref 100–199)
Chol/HDL Ratio: 3.8 ratio (ref 0.0–5.0)
HDL: 35 mg/dL — AB (ref >39)
LDL Calculated: 74 mg/dL (ref 0–99)
TRIGLYCERIDES: 124 mg/dL (ref 0–149)
VLDL CHOLESTEROL CAL: 25 mg/dL (ref 5–40)

## 2017-06-23 LAB — CBC WITH DIFFERENTIAL/PLATELET
Basophils Absolute: 0 10*3/uL (ref 0.0–0.2)
Basos: 1 %
EOS (ABSOLUTE): 0.1 10*3/uL (ref 0.0–0.4)
EOS: 1 %
HEMATOCRIT: 45.4 % (ref 37.5–51.0)
Hemoglobin: 16 g/dL (ref 13.0–17.7)
Immature Grans (Abs): 0 10*3/uL (ref 0.0–0.1)
Immature Granulocytes: 0 %
LYMPHS ABS: 1.4 10*3/uL (ref 0.7–3.1)
Lymphs: 32 %
MCH: 29.8 pg (ref 26.6–33.0)
MCHC: 35.2 g/dL (ref 31.5–35.7)
MCV: 85 fL (ref 79–97)
MONOCYTES: 9 %
Monocytes Absolute: 0.4 10*3/uL (ref 0.1–0.9)
Neutrophils Absolute: 2.5 10*3/uL (ref 1.4–7.0)
Neutrophils: 57 %
Platelets: 162 10*3/uL (ref 150–450)
RBC: 5.37 x10E6/uL (ref 4.14–5.80)
RDW: 14.5 % (ref 12.3–15.4)
WBC: 4.3 10*3/uL (ref 3.4–10.8)

## 2017-06-23 LAB — HIV ANTIBODY (ROUTINE TESTING W REFLEX): HIV SCREEN 4TH GENERATION: NONREACTIVE

## 2017-06-23 LAB — PSA: Prostate Specific Ag, Serum: 0.6 ng/mL (ref 0.0–4.0)

## 2017-06-23 LAB — TSH: TSH: 1.84 u[IU]/mL (ref 0.450–4.500)

## 2017-12-24 ENCOUNTER — Ambulatory Visit: Payer: 59 | Admitting: Family Medicine
